# Patient Record
Sex: Female | Born: 1990 | Race: White | Hispanic: No | Marital: Single | Smoking: Never smoker
Health system: Southern US, Community
[De-identification: ages and names within clinical notes are randomized; demographics above are authoritative.]

## PROBLEM LIST (undated history)

## (undated) DIAGNOSIS — IMO0002 Reserved for concepts with insufficient information to code with codable children: Secondary | ICD-10-CM

## (undated) DIAGNOSIS — A64 Unspecified sexually transmitted disease: Secondary | ICD-10-CM

## (undated) DIAGNOSIS — R569 Unspecified convulsions: Secondary | ICD-10-CM

## (undated) HISTORY — PX: WISDOM TOOTH EXTRACTION: SHX21

## (undated) HISTORY — DX: Reserved for concepts with insufficient information to code with codable children: IMO0002

## (undated) HISTORY — DX: Unspecified sexually transmitted disease: A64

## (undated) HISTORY — DX: Unspecified convulsions: R56.9

---
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
## 2010-05-11 DIAGNOSIS — A64 Unspecified sexually transmitted disease: Secondary | ICD-10-CM

## 2010-05-11 HISTORY — DX: Unspecified sexually transmitted disease: A64

## 2010-09-22 HISTORY — PX: OTHER SURGICAL HISTORY: SHX169

## 2010-10-20 LAB — URINE MICROSCOPIC-ADD ON

## 2010-10-20 LAB — URINALYSIS, ROUTINE W REFLEX MICROSCOPIC
Glucose, UA: NEGATIVE mg/dL
Ketones, ur: NEGATIVE mg/dL
Nitrite: NEGATIVE
Specific Gravity, Urine: 1.017 (ref 1.005–1.030)
pH: 6 (ref 5.0–8.0)

## 2010-10-20 LAB — PREGNANCY, URINE

## 2010-10-20 LAB — URINE CULTURE: Colony Count: 10000

## 2013-04-10 HISTORY — PX: OTHER SURGICAL HISTORY: SHX169

## 2013-04-24 ENCOUNTER — Telehealth: Payer: Self-pay | Admitting: Certified Nurse Midwife

## 2013-04-24 NOTE — Telephone Encounter (Signed)
Implanon inserted on 09-22-10 with Dr Tresa Res and last AEX was 06-12-12 with Debbi.  (see pre-EPIC chart) LMTCB.

## 2013-04-24 NOTE — Telephone Encounter (Signed)
Patient wants to schedule an appt to remove the implanon in left arm.

## 2013-04-26 NOTE — Telephone Encounter (Signed)
Message left to return call to Jaelee Laughter at [phone].    

## 2013-04-26 NOTE — Telephone Encounter (Signed)
Spoke with patient. Appointment to remove implanon scheduled. She states she has had irregular periods and that is why she would like it taken out.

## 2013-04-28 NOTE — Telephone Encounter (Signed)
That is the normal bleeding profile with Implanon, you may want to let her know

## 2013-04-29 ENCOUNTER — Telehealth: Payer: Self-pay | Admitting: Certified Nurse Midwife

## 2013-04-29 NOTE — Telephone Encounter (Signed)
Message left to return call to [place] at [phone].   Will give message below. Patient has annual exam scheduled for 06/14/13 with Verner Chol CNM. Will see if patient wants to wait until then so that she may discuss other options.

## 2013-04-29 NOTE — Telephone Encounter (Signed)
Chief Complaint  Patient presents with   returning call  pt says she is returning tracy's call.

## 2013-04-30 ENCOUNTER — Encounter: Payer: Self-pay | Admitting: Certified Nurse Midwife

## 2013-04-30 NOTE — Telephone Encounter (Signed)
Message left to return call to Paula Briggs at [phone].    

## 2013-04-30 NOTE — Telephone Encounter (Signed)
Detailed message left for patient with message from Verner Chol CNM . See telephone encounter.  Will close encounter.

## 2013-05-01 ENCOUNTER — Encounter: Payer: Self-pay | Admitting: Certified Nurse Midwife

## 2013-05-01 ENCOUNTER — Ambulatory Visit: Payer: Self-pay | Admitting: Certified Nurse Midwife

## 2013-05-01 ENCOUNTER — Ambulatory Visit (INDEPENDENT_AMBULATORY_CARE_PROVIDER_SITE_OTHER): Payer: BC Managed Care – PPO | Admitting: Certified Nurse Midwife

## 2013-05-01 VITALS — BP 92/60 | HR 64 | Resp 16 | Ht 68.5 in | Wt 176.0 lb

## 2013-05-01 DIAGNOSIS — Z309 Encounter for contraceptive management, unspecified: Secondary | ICD-10-CM

## 2013-05-01 DIAGNOSIS — Z3046 Encounter for surveillance of implantable subdermal contraceptive: Secondary | ICD-10-CM

## 2013-05-01 MED ORDER — NORETHINDRONE 0.35 MG PO TABS: 1.0000 | ORAL_TABLET | Freq: Every day | ORAL | Status: AC

## 2013-05-01 NOTE — Progress Notes (Signed)
22 yrsCaucasian Single female G0P0000  presents for Implanon removal.   LMP October 13,2014        Contraception POP planned. Questions addressed.  Consent signed. Requests same as above.  HPI neg. History of Chiari malformation   Affect: normal, oriented to time, place and person Healthy female WDWN  Procedure: Patient placed supine on exam table with her left arm  flexed at the elbow and externally rotated.The insertion site was identified on the inner side of upper arm.The device was palpated. Incision site for removal was marked. The removal site was cleansed with betadine solution and 1 ml of 1% Lidocaine was injected at the incision site. A small 2 mm incision was made at the distal end of the implant.The Implanon implant was grasped after extending the incision slightly and opening the tissue capsule with Dr Lathrop's assistance with curved forceps and Implanon was removed gently intact.The implant was shown to the patient and discarded. The incision was closed with steri strips.  No active bleeding was noted. A small adhesive bandage placed over the removal site.  A pressure bandage was place over the site.  Assessment:  Implanon Removal Pt tolerated procedure well. Planned contraception:The current method of family planning is oral progesterone-only contraceptive and condoms.  Plan :Instructions and warning signs and symptoms given.  Remove bandage in 3 days, may remove outer bandage in 24 hours. Questions addressed Rx Micronor see order with instructions to start now. Patient aware of bleeding profile with previous use. POP used due to Chiari malformation.   Return Visit prn and aex 12/14

## 2013-05-05 NOTE — Progress Notes (Signed)
Note reviewed, agree with plan.  Audray Rumore, MD  

## 2013-06-14 ENCOUNTER — Ambulatory Visit: Payer: Self-pay | Admitting: Certified Nurse Midwife

## 2013-06-14 ENCOUNTER — Telehealth: Payer: Self-pay | Admitting: Certified Nurse Midwife

## 2013-06-14 NOTE — Telephone Encounter (Signed)
Patient called and cancelled her AEX today with Ortencia Kick, CNM due to her mom is in the hospital with seizures. Patient is in recall and will call back to reschedule. Did not consider this a DNKA.

## 2013-10-01 ENCOUNTER — Other Ambulatory Visit: Payer: Self-pay | Admitting: Family Medicine

## 2013-10-01 DIAGNOSIS — R112 Nausea with vomiting, unspecified: Secondary | ICD-10-CM

## 2013-10-04 ENCOUNTER — Ambulatory Visit
Admission: RE | Admit: 2013-10-04 | Discharge: 2013-10-04 | Disposition: A | Discharge status: Home / Self Care | Payer: BC Managed Care – PPO | Source: Ambulatory Visit | Attending: Family Medicine | Admitting: Family Medicine

## 2013-10-04 DIAGNOSIS — R112 Nausea with vomiting, unspecified: Secondary | ICD-10-CM

## 2013-11-07 ENCOUNTER — Encounter: Payer: Self-pay | Admitting: Certified Nurse Midwife

## 2013-11-07 ENCOUNTER — Ambulatory Visit (INDEPENDENT_AMBULATORY_CARE_PROVIDER_SITE_OTHER): Payer: BC Managed Care – PPO | Admitting: Certified Nurse Midwife

## 2013-11-07 VITALS — BP 90/60 | HR 68 | Resp 16 | Ht 68.25 in | Wt 164.0 lb

## 2013-11-07 DIAGNOSIS — Z01419 Encounter for gynecological examination (general) (routine) without abnormal findings: Secondary | ICD-10-CM

## 2013-11-07 DIAGNOSIS — Z30017 Encounter for initial prescription of implantable subdermal contraceptive: Secondary | ICD-10-CM

## 2013-11-07 DIAGNOSIS — Z0000 Encounter for general adult medical examination without abnormal findings: Secondary | ICD-10-CM

## 2013-11-07 LAB — POCT URINALYSIS DIPSTICK
Bilirubin, UA: NEGATIVE
GLUCOSE UA: NEGATIVE
Ketones, UA: NEGATIVE
Leukocytes, UA: NEGATIVE
Nitrite, UA: NEGATIVE
Protein, UA: NEGATIVE
RBC UA: NEGATIVE
UROBILINOGEN UA: NEGATIVE
pH, UA: 7

## 2013-11-07 NOTE — Patient Instructions (Signed)
General topics  Next pap or exam is  due in 1 year Take a Women's multivitamin Take 1200 mg. of calcium daily - prefer dietary If any concerns in interim to call back  Breast Self-Awareness Practicing breast self-awareness may pick up problems early, prevent significant medical complications, and possibly save your life. By practicing breast self-awareness, you can become familiar with how your breasts look and feel and if your breasts are changing. This allows you to notice changes early. It can also offer you some reassurance that your breast health is good. One way to learn what is normal for your breasts and whether your breasts are changing is to do a breast self-exam. If you find a lump or something that was not present in the past, it is best to contact your caregiver right away. Other findings that should be evaluated by your caregiver include nipple discharge, especially if it is bloody; skin changes or reddening; areas where the skin seems to be pulled in (retracted); or new lumps and bumps. Breast pain is seldom associated with cancer (malignancy), but should also be evaluated by a caregiver. BREAST SELF-EXAM The best time to examine your breasts is 5 7 days after your menstrual period is over.  ExitCare Patient Information 2013 [place].   Exercise to Stay Healthy Exercise helps you become and stay healthy. EXERCISE IDEAS AND TIPS Choose exercises that:  You enjoy.  Fit into your day. You do not need to exercise really hard to be healthy. You can do exercises at a slow or medium level and stay healthy. You can:  Stretch before and after working out.  Try yoga, Pilates, or tai chi.  Lift weights.  Walk fast, swim, jog, run, climb stairs, bicycle, dance, or rollerskate.  Take aerobic classes. Exercises that burn about 150 calories:  Running 1  miles in 15 minutes.  Playing volleyball for 45 to 60 minutes.  Washing and waxing a car for 45 to 60  minutes.  Playing touch football for 45 minutes.  Walking 1  miles in 35 minutes.  Pushing a stroller 1  miles in 30 minutes.  Playing basketball for 30 minutes.  Raking leaves for 30 minutes.  Bicycling 5 miles in 30 minutes.  Walking 2 miles in 30 minutes.  Dancing for 30 minutes.  Shoveling snow for 15 minutes.  Swimming laps for 20 minutes.  Walking up stairs for 15 minutes.  Bicycling 4 miles in 15 minutes.  Gardening for 30 to 45 minutes.  Jumping rope for 15 minutes.  Washing windows or floors for 45 to 60 minutes. Document Released: 07/30/2010 Document Revised: 09/19/2011 Document Reviewed: 07/30/2010 [facility] Patient Information 2013 [place].   Other topics ( that may be useful information):    Sexually Transmitted Disease Sexually transmitted disease (STD) refers to any infection that is passed from person to person during sexual activity. This may happen by way of saliva, semen, blood, vaginal mucus, or urine. Common STDs include:  Gonorrhea.  Chlamydia.  Syphilis.  HIV/AIDS.  Genital herpes.  Hepatitis B and C.  Trichomonas.  Human papillomavirus (HPV).  Pubic lice. CAUSES  An STD may be spread by bacteria, virus, or parasite. A person can get an STD by:  Sexual intercourse with an infected person.  Sharing sex toys with an infected person.  Sharing needles with an infected person.  Having intimate contact with the genitals, mouth, or rectal areas of an infected person. SYMPTOMS  Some people may not have any symptoms, but  they can still pass the infection to others. Different STDs have different symptoms. Symptoms include:  Painful or bloody urination.  Pain in the pelvis, abdomen, vagina, anus, throat, or eyes.  Skin rash, itching, irritation, growths, or sores (lesions). These usually occur in the genital or anal area.  Abnormal vaginal discharge.  Penile discharge in men.  Soft, flesh-colored skin growths in the  genital or anal area.  Fever.  Pain or bleeding during sexual intercourse.  Swollen glands in the groin area.  Yellow skin and eyes (jaundice). This is seen with hepatitis. DIAGNOSIS  To make a diagnosis, your caregiver may:  Take a medical history.  Perform a physical exam.  Take a specimen (culture) to be examined.  Examine a sample of discharge under a microscope.  Perform blood test TREATMENT   Chlamydia, gonorrhea, trichomonas, and syphilis can be cured with antibiotic medicine.  Genital herpes, hepatitis, and HIV can be treated, but not cured, with prescribed medicines. The medicines will lessen the symptoms.  Genital warts from HPV can be treated with medicine or by freezing, burning (electrocautery), or surgery. Warts may come back.  HPV is a virus and cannot be cured with medicine or surgery.However, abnormal areas may be followed very closely by your caregiver and may be removed from the cervix, vagina, or vulva through office procedures or surgery. If your diagnosis is confirmed, your recent sexual partners need treatment. This is true even if they are symptom-free or have a negative culture or evaluation. They should not have sex until their caregiver says it is okay. HOME CARE INSTRUCTIONS  All sexual partners should be informed, tested, and treated for all STDs.  Take your antibiotics as directed. Finish them even if you start to feel better.  Only take over-the-counter or prescription medicines for pain, discomfort, or fever as directed by your caregiver.  Rest.  Eat a balanced diet and drink enough fluids to keep your urine clear or pale yellow.  Do not have sex until treatment is completed and you have followed up with your caregiver. STDs should be checked after treatment.  Keep all follow-up appointments, Pap tests, and blood tests as directed by your caregiver.  Only use latex condoms and water-soluble lubricants during sexual activity. Do not use  petroleum jelly or oils.  Avoid alcohol and illegal drugs.  Get vaccinated for HPV and hepatitis. If you have not received these vaccines in the past, talk to your caregiver about whether one or both might be right for you.  Avoid risky sex practices that can break the skin. The only way to avoid getting an STD is to avoid all sexual activity.Latex condoms and dental dams (for oral sex) will help lessen the risk of getting an STD, but will not completely eliminate the risk. SEEK MEDICAL CARE IF:   You have a fever.  You have any new or worsening symptoms. Document Released: 09/17/2002 Document Revised: 09/19/2011 Document Reviewed: 09/24/2010 [facility] Patient Information 2013 [place].    Domestic Abuse You are being battered or abused if someone close to you hits, pushes, or physically hurts you in any way. You also are being abused if you are forced into activities. You are being sexually abused if you are forced to have sexual contact of any kind. You are being emotionally abused if you are made to feel worthless or if you are constantly threatened. It is important to remember that help is available. No one has the right to abuse you. PREVENTION OF FURTHER  ABUSE  Learn the warning signs of danger. This varies with situations but may include: the use of alcohol, threats, isolation from friends and family, or forced sexual contact. Leave if you feel that violence is going to occur.  If you are attacked or beaten, report it to the police so the abuse is documented. You do not have to press charges. The police can protect you while you or the attackers are leaving. Get the officer's name and badge number and a copy of the report.  Find someone you can trust and tell them what is happening to you: your caregiver, a nurse, clergy member, close friend or family member. Feeling ashamed is natural, but remember that you have done nothing wrong. No one deserves abuse. Document Released:  06/24/2000 Document Revised: 09/19/2011 Document Reviewed: 09/02/2010 ExitCare Patient Information 2013 [employer].    How Much is Too Much Alcohol? Drinking too much alcohol can cause injury, accidents, and health problems. These types of problems can include:   Car crashes.  Falls.  Family fighting (domestic violence).  Drowning.  Fights.  Injuries.  Burns.  Damage to certain organs.  Having a baby with birth defects. ONE DRINK CAN BE TOO MUCH WHEN YOU ARE:  Working.  Pregnant or breastfeeding.  Taking medicines. Ask your doctor.  Driving or planning to drive. If you or someone you know has a drinking problem, get help from a doctor.  Document Released: 04/23/2009 Document Revised: 09/19/2011 Document Reviewed: 04/23/2009 ExitCare Patient Information 2013 [employer].   Smoking Hazards Smoking cigarettes is extremely bad for your health. Tobacco smoke has over 200 known poisons in it. There are over 60 chemicals in tobacco smoke that cause cancer. Some of the chemicals found in cigarette smoke include:   Cyanide.  Benzene.  Formaldehyde.  Methanol (wood alcohol).  Acetylene (fuel used in welding torches).  Ammonia. Cigarette smoke also contains the poisonous gases nitrogen oxide and carbon monoxide.  Cigarette smokers have an increased risk of many serious medical problems and Smoking causes approximately:  90% of all lung cancer deaths in men.  80% of all lung cancer deaths in women.  90% of deaths from chronic obstructive lung disease. Compared with nonsmokers, smoking increases the risk of:  Coronary heart disease by 2 to 4 times.  Stroke by 2 to 4 times.  Men developing lung cancer by 23 times.  Women developing lung cancer by 13 times.  Dying from chronic obstructive lung diseases by 12 times.  . Smoking is the most preventable cause of death and disease in our society.  WHY IS SMOKING ADDICTIVE?  Nicotine is the chemical  agent in tobacco that is capable of causing addiction or dependence.  When you smoke and inhale, nicotine is absorbed rapidly into the bloodstream through your lungs. Nicotine absorbed through the lungs is capable of creating a powerful addiction. Both inhaled and non-inhaled nicotine may be addictive.  Addiction studies of cigarettes and spit tobacco show that addiction to nicotine occurs mainly during the teen years, when young people begin using tobacco products. WHAT ARE THE BENEFITS OF QUITTING?  There are many health benefits to quitting smoking.   Likelihood of developing cancer and heart disease decreases. Health improvements are seen almost immediately.  Blood pressure, pulse rate, and breathing patterns start returning to normal soon after quitting. QUITTING SMOKING   American Lung Association - 1-800-LUNGUSA  American Cancer Society - 1-800-ACS-2345 Document Released: 08/04/2004 Document Revised: 09/19/2011 Document Reviewed: 04/08/2009 ExitCare Patient Information 2013 ExitCare,   LLC.   Stress Management Stress is a state of physical or mental tension that often results from changes in your life or normal routine. Some common causes of stress are:  Death of a loved one.  Injuries or severe illnesses.  Getting fired or changing jobs.  Moving into a new home. Other causes may be:  Sexual problems.  Business or financial losses.  Taking on a large debt.  Regular conflict with someone at home or at work.  Constant tiredness from lack of sleep. It is not just bad things that are stressful. It may be stressful to:  Win the lottery.  Get married.  Buy a new car. The amount of stress that can be easily tolerated varies from person to person. Changes generally cause stress, regardless of the types of change. Too much stress can affect your health. It may lead to physical or emotional problems. Too little stress (boredom) may also become stressful. SUGGESTIONS TO  REDUCE STRESS:  Talk things over with your family and friends. It often is helpful to share your concerns and worries. If you feel your problem is serious, you may want to get help from a professional counselor.  Consider your problems one at a time instead of lumping them all together. Trying to take care of everything at once may seem impossible. List all the things you need to do and then start with the most important one. Set a goal to accomplish 2 or 3 things each day. If you expect to do too many in a single day you will naturally fail, causing you to feel even more stressed.  Do not use alcohol or drugs to relieve stress. Although you may feel better for a short time, they do not remove the problems that caused the stress. They can also be habit forming.  Exercise regularly - at least 3 times per week. Physical exercise can help to relieve that "uptight" feeling and will relax you.  The shortest distance between despair and hope is often a good night's sleep.  Go to bed and get up on time allowing yourself time for appointments without being rushed.  Take a short "time-out" period from any stressful situation that occurs during the day. Close your eyes and take some deep breaths. Starting with the muscles in your face, tense them, hold it for a few seconds, then relax. Repeat this with the muscles in your neck, shoulders, hand, stomach, back and legs.  Take good care of yourself. Eat a balanced diet and get plenty of rest.  Schedule time for having fun. Take a break from your daily routine to relax. HOME CARE INSTRUCTIONS   Call if you feel overwhelmed by your problems and feel you can no longer manage them on your own.  Return immediately if you feel like hurting yourself or someone else. Document Released: 12/21/2000 Document Revised: 09/19/2011 Document Reviewed: 08/13/2007 [facility] Patient Information 2013 [place].  Take care and be happy!!! Debbi

## 2013-11-07 NOTE — Progress Notes (Signed)
23 y.o. G0P0000 Single Caucasian Fe here for annual exam. Periods regular now, has not been taking POP, due to forgetting. Patient would like to have Nexplanon, had Implanon in past without problems. Mother diagnosed with unknown autoimmune disease, [facility]. Patient emotionally doing "OK". Friends supportive. Desires STD screening. 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 No health issues today.  Patient's last menstrual period was 10/14/2013.          Sexually active: no  The current method of family planning is OCP.    Exercising: yes  Cardio and weights Smoker:  no  Health Maintenance: Pap:  05/2011 Neg  MMG:  Never Colonoscopy:  Never BMD:   Never TDaP:  2010 Labs:  Hem: 12.9 UA: Clear   reports that she has never smoked. She has never used smokeless tobacco. She reports that she drinks about one ounce of alcohol per week. She reports that she does not use illicit drugs.  Past Medical History  Diagnosis Date  . Chiari malformation   . Seizures   . STD (sexually transmitted disease) 11/11    Chl    Past Surgical History  Procedure Laterality Date  . Wisdom tooth extraction    . Implanon insertion  09-22-10  . Implanon removal  04/2013    Current Outpatient Prescriptions  Medication Sig Dispense Refill  . cetirizine (ZYRTEC) 10 MG tablet Take 10 mg by mouth daily.      . norethindrone (MICRONOR,CAMILA,ERRIN) 0.35 MG tablet Take 1 tablet (0.35 mg total) by mouth daily.  3 Package  4   No current facility-administered medications for this visit.    Family History  Problem Relation Age of Onset  . Cancer Mother     skin    ROS:  Pertinent items are noted in HPI.  Otherwise, a comprehensive ROS was negative.  Exam:   BP 90/60  Pulse 68  Resp 16  Ht 5' 8.25" (1.734 m)  Wt 164 lb (74.39 kg)  BMI 24.74 kg/m2  LMP 10/14/2013 Height: 5' 8.25" (173.4 cm)  Ht Readings from Last 3 Encounters:  11/07/13 5' 8.25"  (1.734 m)  05/01/13 5' 8.5" (1.74 m)    General appearance: alert, cooperative and appears stated age Head: Normocephalic, without obvious abnormality, atraumatic Neck: no adenopathy, supple, symmetrical, trachea midline and thyroid normal to inspection and palpation and non-palpable Lungs: clear to auscultation bilaterally Breasts: normal appearance, no masses or tenderness, No nipple retraction or dimpling, No nipple discharge or bleeding, No axillary or supraclavicular adenopathy Heart: regular rate and rhythm Abdomen: soft, non-tender; no masses,  no organomegaly Extremities: extremities normal, atraumatic, no cyanosis or edema Skin: Skin color, texture, turgor normal. No rashes or lesions Lymph nodes: Cervical, supraclavicular, and axillary nodes normal. No abnormal inguinal nodes palpated Neurologic: Grossly normal   Pelvic: External genitalia:  no lesions              Urethra:  normal appearing urethra with no masses, tenderness or lesions              Bartholin's and Skene's: normal                 Vagina: normal appearing vagina with normal color and discharge, no lesions              Cervix: normal, non tender              Pap taken: yes Bimanual Exam:  Uterus:  normal size, contour, position, consistency, mobility, non-tender and anteverted              Adnexa: normal adnexa and no mass, fullness, tenderness               Rectovaginal: Confirms               Anus:  deferred  A:  Well Woman with normal exam  Contraception none at present, not sexually active, desires Nexplanon  STD screening  P:   Reviewed health and wellness pertinent to exam  Reviewed risks/benefits/ bleeding profile with Nexplanon. Patient had Implanon and is aware. Requests scheduling. Discussed insurance personnel witll precert and then she will be notified of coverage. Instructed  It must be inserted on days 1-5 of period. Will need to call when period starts. Patient voiced understanding  Pap smear  taken today with reflex  Lab:GC,Chlamydia,HIV,RPR   counseled on breast self exam, STD prevention, HIV risk factors and prevention, adequate intake of calcium and vitamin D, diet and exercise  return annually or prn  An After Visit Summary was printed and given to the patient.

## 2013-11-07 NOTE — Progress Notes (Signed)
Reviewed personally.  M. Suzanne Taishawn Smaldone, MD.  

## 2013-11-08 LAB — HIV ANTIBODY (ROUTINE TESTING W REFLEX): HIV 1&2 Ab, 4th Generation: NONREACTIVE

## 2013-11-08 LAB — HEMOGLOBIN, FINGERSTICK: Hemoglobin, fingerstick: 12.9 g/dL (ref 12.0–16.0)

## 2013-11-08 LAB — RPR

## 2013-11-11 LAB — IPS PAP TEST WITH REFLEX TO HPV

## 2013-11-12 ENCOUNTER — Telehealth: Payer: Self-pay | Admitting: Certified Nurse Midwife

## 2013-11-12 NOTE — Telephone Encounter (Signed)
Advised patient that nexplanon will be covered at 100% based on benefit quote received. Patient to call back with cycle to schedule insertion.

## 2013-11-19 ENCOUNTER — Telehealth: Payer: Self-pay

## 2013-11-19 LAB — IPS N GONORRHOEA AND CHLAMYDIA BY PCR

## 2013-11-19 LAB — IPS PAP TEST WITH REFLEX TO HPV

## 2013-11-19 NOTE — Telephone Encounter (Signed)
lmtcb

## 2013-11-19 NOTE — Telephone Encounter (Signed)
Message copied by Eliezer BottomJOHNSON, Kathee Tumlin J on Tue Nov 19, 2013  4:06 PM ------      Message from: Verner CholLEONARD, DEBORAH S      Created: Tue Nov 19, 2013  2:29 PM       Notify patient RPR,HIV,GC,Chlamydia negative      Pap smear negative      02 ------

## 2013-11-20 NOTE — Telephone Encounter (Signed)
Left message for call back.

## 2013-11-21 NOTE — Telephone Encounter (Signed)
Left message for call back.

## 2013-11-22 NOTE — Telephone Encounter (Signed)
Spoke with patient. Patient started menses last night and would like to schedule appointment to have Nexplanon inserted. Appointment scheduled for Monday at 1300 with Dr.Miller (time per Kennon RoundsSally). Patient has been notified of benefits for procedure. Patient agreeable to date and time.  Routing to provider for final review. Patient agreeable to disposition. Will close encounter  Routing to Dr.Miller as covering ZO:XWRUEAVCC:Deborah Darrell JewelS. Leonard CNM

## 2013-11-22 NOTE — Telephone Encounter (Signed)
Patient is requesting appt for Implanon insertion

## 2013-11-25 ENCOUNTER — Ambulatory Visit (INDEPENDENT_AMBULATORY_CARE_PROVIDER_SITE_OTHER): Payer: BC Managed Care – PPO | Admitting: Obstetrics & Gynecology

## 2013-11-25 VITALS — BP 98/58 | HR 64 | Resp 16 | Ht 68.25 in | Wt 168.8 lb

## 2013-11-25 DIAGNOSIS — Z30017 Encounter for initial prescription of implantable subdermal contraceptive: Secondary | ICD-10-CM

## 2013-11-25 NOTE — Patient Instructions (Signed)
Please call with any new problems/issues/concerns

## 2013-11-25 NOTE — Progress Notes (Signed)
Patient ID: Paula PlumberJuliet Briggs, female   DOB: 1990/08/18, 23 y.o.   MRN: [id number]  22 yrs CaucasianSinglefemaleG0P0000 presents for Nexplanon insertion. LMP: 11/21/13.  Cycles are not regular.  Last one was six weeks between cycle.  Contraception oral contraceptives   Questions addressed.  Consent signed.  HPI neg.  Exam: WNWD WF, NAD Healthy female  Procedure: Patient placed supine on exam table with herleft arm   flexed at the elbow and externally rotated.The insertion site was identified on the inner side of upper arm.  Two marks were made 8-10cm above the medial epicondyle of the humerus.  The first mark for insertion and the second mark 4cm from the first. The insertion sited was cleansed with betadine solution. 2 ml of 1% Lidocaine was injected along the track of the insertion site. The Nexaplon under sterile conditions was inserted in left arm. The implant was palpated in the arm after insertion. A small adhesive bandage placed over insertion site. The patient palpated the device for monitoring of the device. No active bleeding was noted.  A pressure bandage was place over the site. Lot [id number]. Exp 04/2016  Assessment:Implanon/Nexaplon Insertion Pt tolerated procedure well.  Plan:Instructions and warning signs and symptoms given  Questions addressed  Return prn or for AEWX

## 2014-10-02 IMAGING — US US ABDOMEN COMPLETE
1 series · 14 of 25 positions shown · non-contrast
Comparison: None.

CLINICAL DATA: Nausea, vomiting.

EXAM:
ULTRASOUND ABDOMEN COMPLETE

[Series 1: us abdomen complete · 0.18mm/px · 14 of 78 slices shown]
[im 1/78]
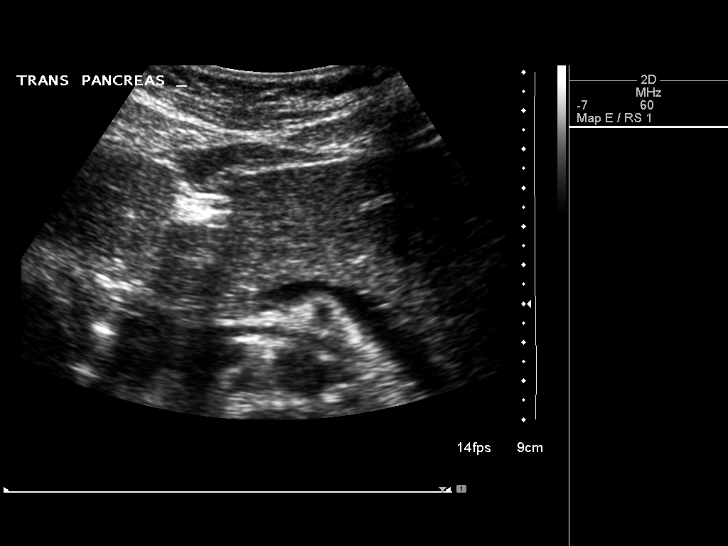
[im 7/78]
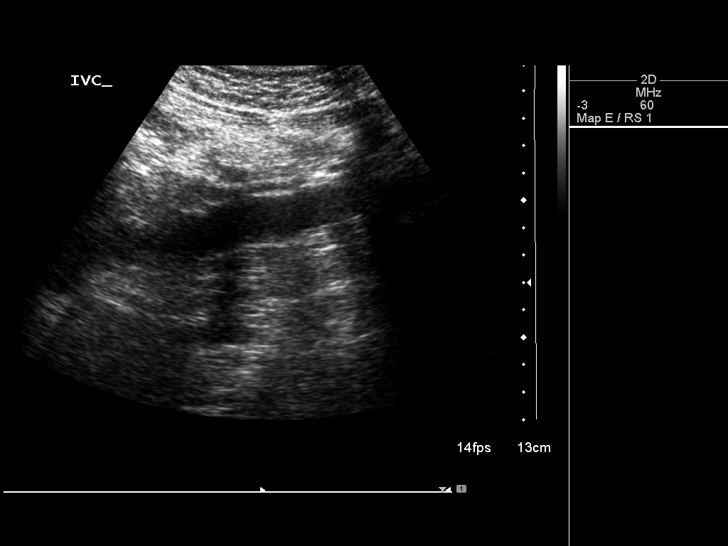
[im 13/78]
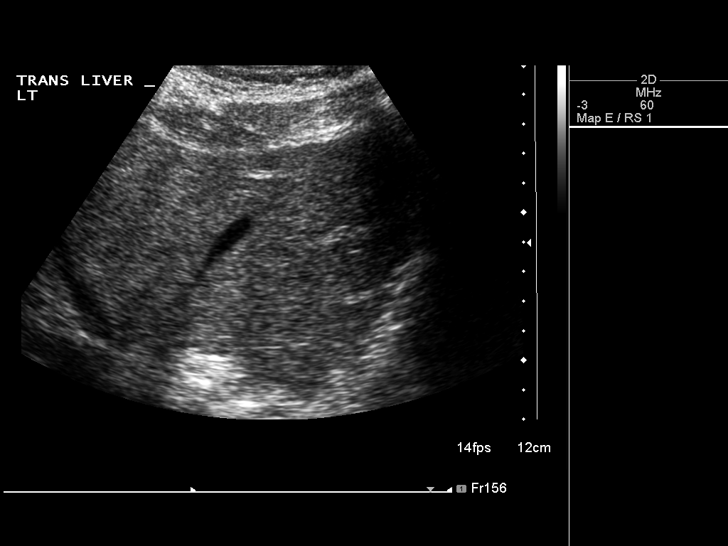
[im 20/78]
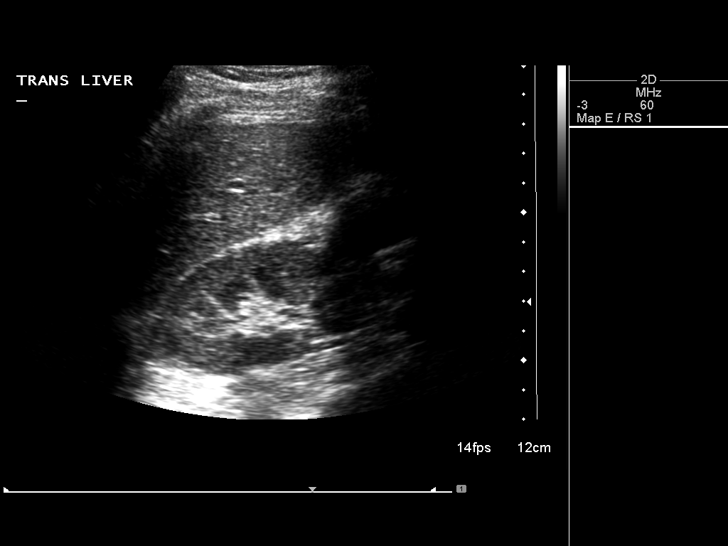
[im 26/78]
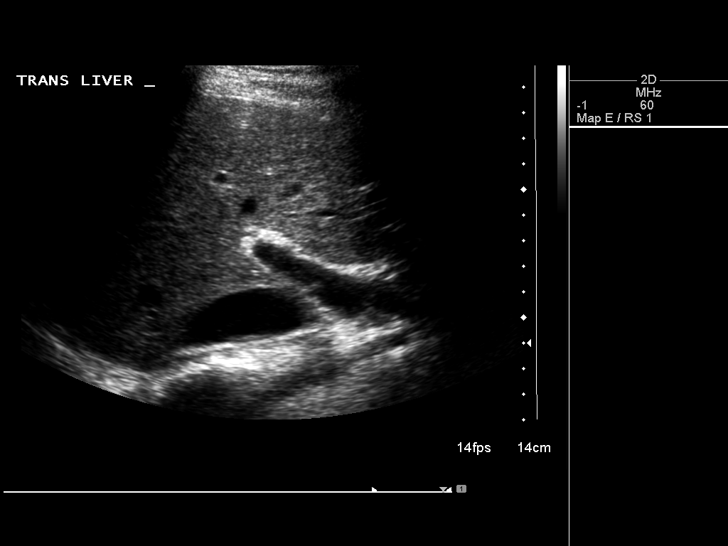
[im 29/78]
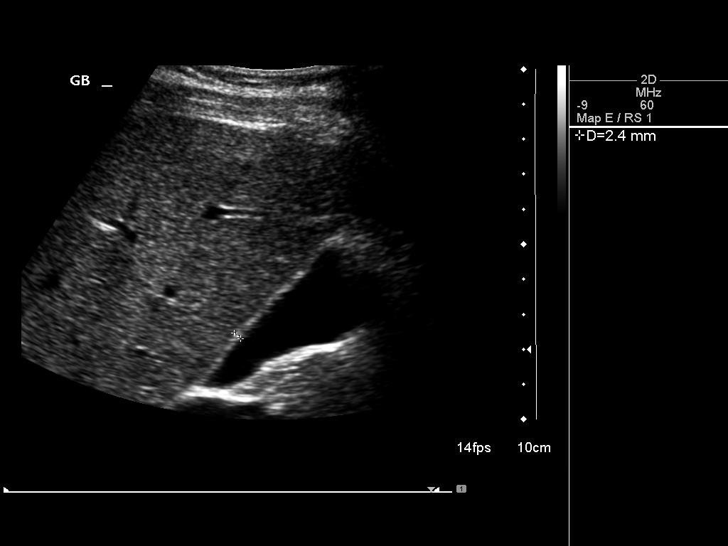
[im 36/78]
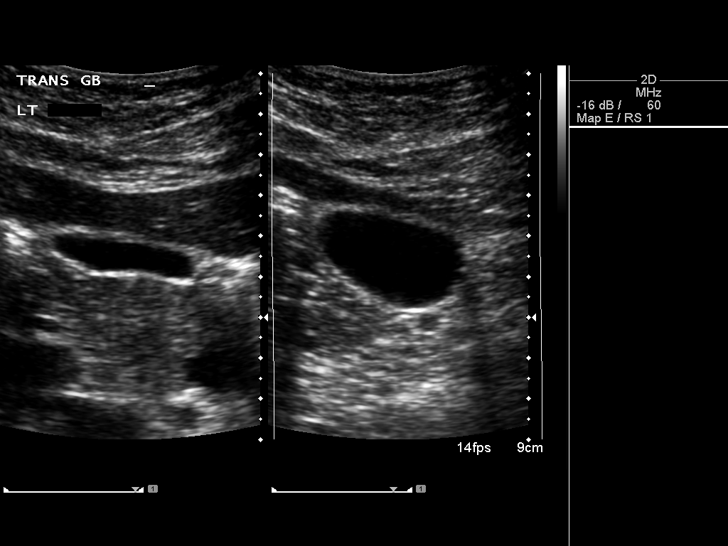
[im 42/78]
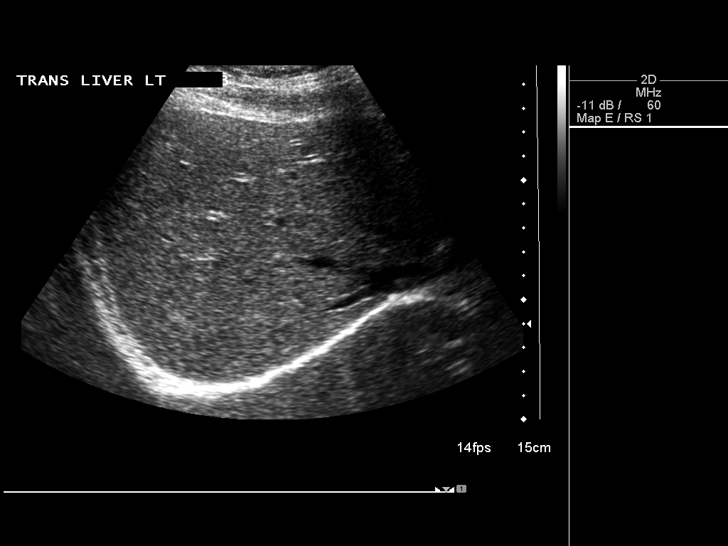
[im 49/78]
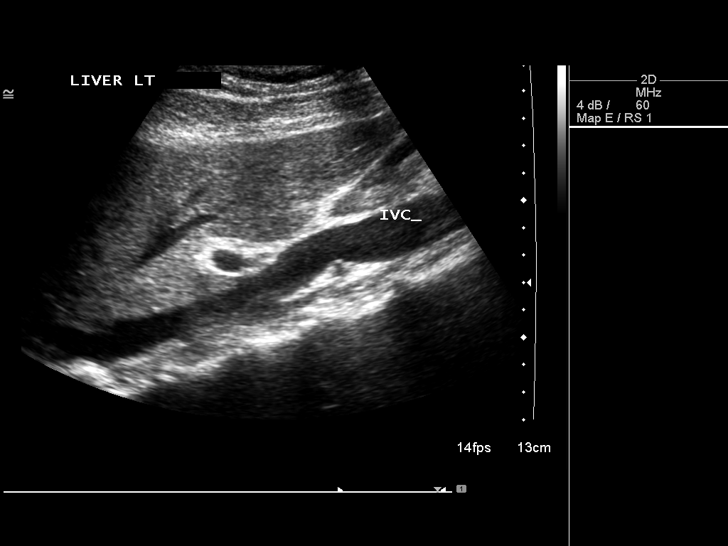
[im 52/78]
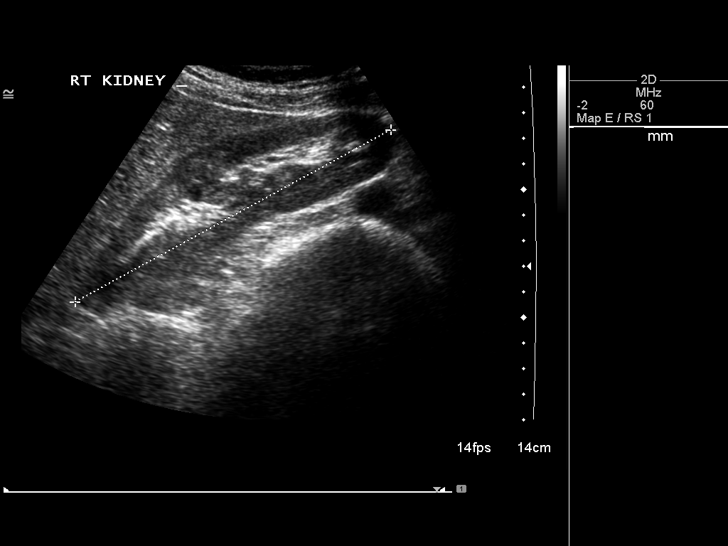
[im 58/78]
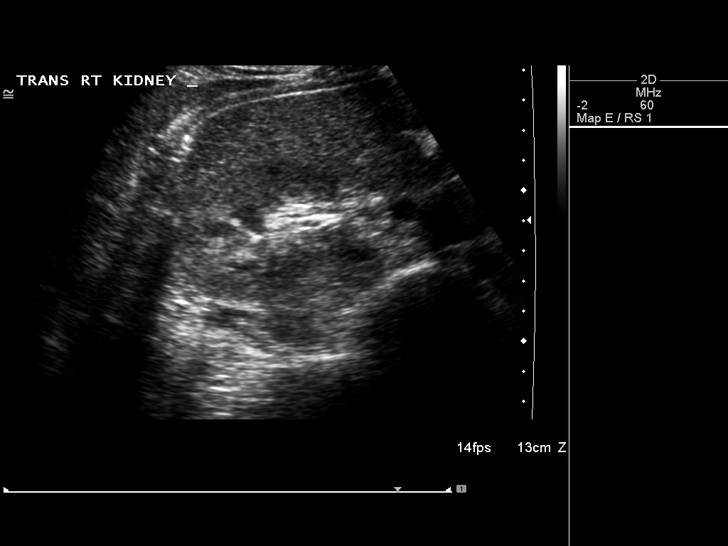
[im 65/78]
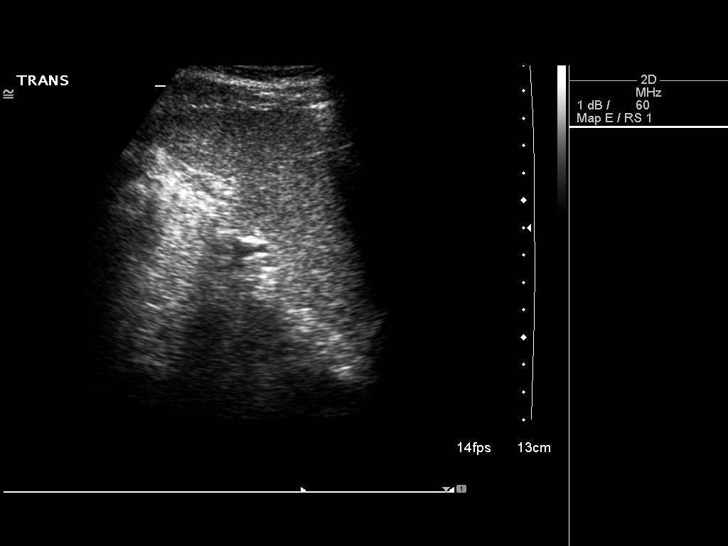
[im 71/78]
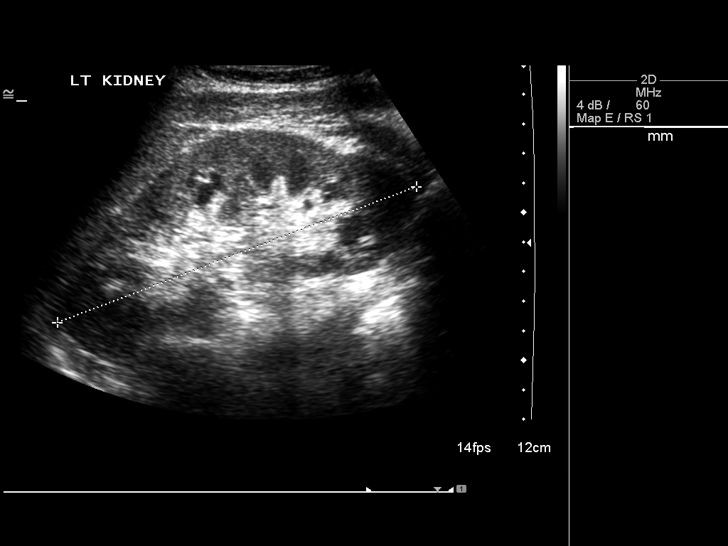
[im 78/78]
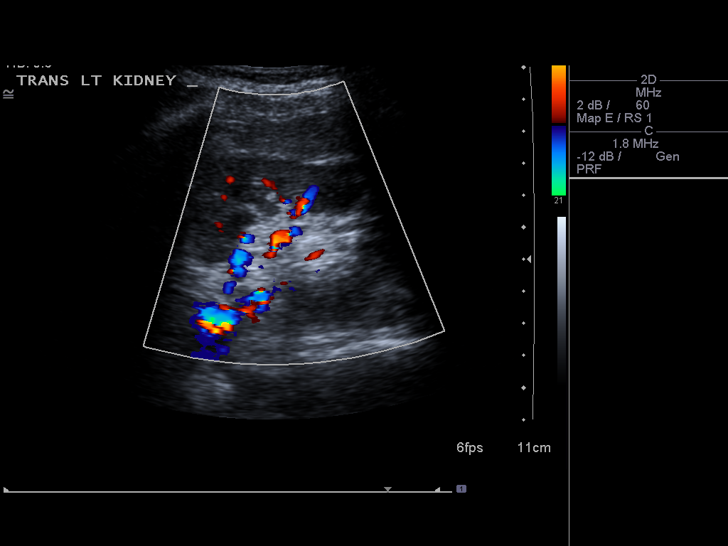

[14 of 25 positions shown; findings below may reference images not displayed]

FINDINGS: Gallbladder:

No gallstones or wall thickening visualized. No sonographic Murphy
sign noted.

Common bile duct:

Diameter: Normal caliber, 3 mm

Liver:

No focal lesion identified. Within normal limits in parenchymal
echogenicity.

IVC:

No abnormality visualized.

Pancreas:

Visualized portion unremarkable.

Spleen:

Size and appearance within normal limits.

Right Kidney:

Length: 14.1 cm. Echogenicity within normal limits. No mass or
hydronephrosis visualized.

Left Kidney:

Length: 13.0 cm. Echogenicity within normal limits. No mass or
hydronephrosis visualized.

Abdominal aorta:

No aneurysm visualized.

Other findings:

None.
IMPRESSION: Unremarkable study.
